# Patient Record
Sex: Female | Born: 1970 | Race: Black or African American | Hispanic: No | Marital: Single | Smoking: Never smoker
Health system: Southern US, Community
[De-identification: ages and names within clinical notes are randomized; demographics above are authoritative.]

## PROBLEM LIST (undated history)

## (undated) HISTORY — PX: KNEE ARTHROSCOPY: SUR90

---

## 2003-07-29 ENCOUNTER — Other Ambulatory Visit: Admission: RE | Admit: 2003-07-29 | Discharge: 2003-07-29 | Payer: Self-pay | Admitting: Obstetrics and Gynecology

## 2003-10-25 ENCOUNTER — Ambulatory Visit (HOSPITAL_COMMUNITY): Admission: RE | Admit: 2003-10-25 | Discharge: 2003-10-25 | Payer: Self-pay | Admitting: Obstetrics and Gynecology

## 2004-01-09 ENCOUNTER — Emergency Department (HOSPITAL_COMMUNITY): Admission: EM | Admit: 2004-01-09 | Discharge: 2004-01-10 | Payer: Self-pay | Admitting: Emergency Medicine

## 2004-01-10 ENCOUNTER — Emergency Department (HOSPITAL_COMMUNITY): Admission: EM | Admit: 2004-01-10 | Discharge: 2004-01-10 | Payer: Self-pay | Admitting: Emergency Medicine

## 2004-01-13 ENCOUNTER — Emergency Department (HOSPITAL_COMMUNITY): Admission: EM | Admit: 2004-01-13 | Discharge: 2004-01-13 | Payer: Self-pay | Admitting: Emergency Medicine

## 2017-01-08 ENCOUNTER — Encounter: Payer: Self-pay | Admitting: *Deleted

## 2017-01-08 ENCOUNTER — Ambulatory Visit
Admission: EM | Admit: 2017-01-08 | Discharge: 2017-01-08 | Disposition: A | Discharge status: Home / Self Care | Payer: 59 | Attending: Family Medicine | Admitting: Family Medicine

## 2017-01-08 DIAGNOSIS — R6889 Other general symptoms and signs: Secondary | ICD-10-CM

## 2017-01-08 LAB — RAPID STREP SCREEN (MED CTR MEBANE ONLY): STREPTOCOCCUS, GROUP A SCREEN (DIRECT): NEGATIVE

## 2017-01-08 MED ORDER — OSELTAMIVIR PHOSPHATE 75 MG PO CAPS: 75.0000 mg | ORAL_CAPSULE | Freq: Two times a day (BID) | ORAL | 0 refills | Status: AC

## 2017-01-08 MED ORDER — BENZONATATE 100 MG PO CAPS: 100.0000 mg | ORAL_CAPSULE | Freq: Three times a day (TID) | ORAL | 0 refills | Status: AC | PRN

## 2017-01-08 MED ORDER — HYDROCOD POLST-CPM POLST ER 10-8 MG/5ML PO SUER: 5.0000 mL | Freq: Every evening | ORAL | 0 refills | Status: AC | PRN

## 2017-01-08 NOTE — ED Provider Notes (Signed)
[facility] ____________________________________________  Time seen: Approximately 1035 AM  I have reviewed the triage vital signs and the nursing notes.   HISTORY  Chief Complaint Cough; Sore Throat; Fever; Chills; and Generalized Body Aches   HPI Elizabeth Lutz is a 46 y.o. female presenting for the complaints of 2 days of runny nose, nasal congestion, sore throat, chills and body aches. Reports chills and body aches, but denies known fevers. Reports has been taken some over-the-counter Tylenol intermittently. Reports cough is a dry hacking cough intermittently wakes her up, and states that she did not sleep well last night because of the cough. Reports cough is occasionally productive of whitish to yellowish mucus. Patient reports she had one single episode this morning of coughing forcefully several times in a row and then produced some mucus that had a very small amount of blood tinge to it. Denies any other blood in sputum events. Denies any blood present with blowing nose. Denies any other abnormal bleeding or bruising. Reports has continued to eat and drink well. Reports multiple sick contacts at work. Denies home sick contacts.   Denies chest pain, shortness of breath, chest pain with deep breath, abdominal pain, dysuria, extremity pain, extremity swelling or rash. Denies hematochezia, melena or abnormal colored stools. Denies recent sickness. Denies recent antibiotic use.    Patient's last menstrual period was 12/11/2016 (exact date).: Denies pregnancy.  History reviewed. No pertinent past medical history.  There are no active problems to display for this patient.   Past Surgical History:  Procedure Laterality Date  . KNEE ARTHROSCOPY Right      No current facility-administered medications for this encounter.   Current Outpatient Prescriptions:  .  benzonatate (TESSALON PERLES) 100 MG capsule, Take 1 capsule (100 mg total) by mouth 3 (three) times daily as  needed for cough., Disp: 15 capsule, Rfl: 0 .  chlorpheniramine-HYDROcodone (TUSSIONEX PENNKINETIC ER) 10-8 MG/5ML SUER, Take 5 mLs by mouth at bedtime as needed for cough. do not drive or operate machinery while taking as can cause drowsiness., Disp: 75 mL, Rfl: 0 .  oseltamivir (TAMIFLU) 75 MG capsule, Take 1 capsule (75 mg total) by mouth every 12 (twelve) hours., Disp: 10 capsule, Rfl: 0  Allergies Patient has no known allergies.  History reviewed. No pertinent family history.  Social History Social History  Substance Use Topics  . Smoking status: Never Smoker  . Smokeless tobacco: Never Used  . Alcohol use Yes    Review of Systems Constitutional: As above Eyes: No visual changes. ENT: Positive sore throat. Cardiovascular: Denies chest pain. Respiratory: Denies shortness of breath. Gastrointestinal: No abdominal pain.  No nausea, no vomiting.  No diarrhea.  No constipation. Genitourinary: Negative for dysuria. Musculoskeletal: Negative for back pain. Skin: Negative for rash. Neurological: Negative for headaches, focal weakness or numbness.  10-point ROS otherwise negative.  ____________________________________________   PHYSICAL EXAM:  VITAL SIGNS: ED Triage Vitals  Enc Vitals Group     BP 01/08/17 1025 (!) 141/90     Pulse Rate 01/08/17 1025 78     Resp 01/08/17 1025 16     Temp 01/08/17 1025 99.1 F (37.3 C)     Temp Source 01/08/17 1025 Oral     SpO2 01/08/17 1025 99 %     Weight 01/08/17 1028 145 lb (65.8 kg)     Height 01/08/17 1028 5\' 5"  (1.651 m)     Head Circumference --      Peak Flow --  Pain Score 01/08/17 1057 2     Pain Loc --      Pain Edu? --      Excl. in GC? --     Constitutional: Alert and oriented. Well appearing and in no acute distress. Eyes: Conjunctivae are normal. PERRL. EOMI. Head: Atraumatic. No sinus tenderness to palpation. No swelling. No erythema.  Ears: no erythema, normal TMs bilaterally.   Nose:Nasal congestion with  clear rhinorrhea  Mouth/Throat: Mucous membranes are moist. Mild pharyngeal erythema. No tonsillar swelling or exudate.  Neck: No stridor.  No cervical spine tenderness to palpation. Hematological/Lymphatic/Immunilogical: No cervical lymphadenopathy. Cardiovascular: Normal rate, regular rhythm. Grossly normal heart sounds.  Good peripheral circulation. Respiratory: Normal respiratory effort.  No retractions. No wheezes, rales or rhonchi. Good air movement. Dry intermittent cough noted in room. Gastrointestinal: Soft and nontender. No CVA tenderness. Musculoskeletal: Ambulatory with steady gait. No cervical, thoracic or lumbar tenderness to palpation. Neurologic:  Normal speech and language. No gait instability. Skin:  Skin appears warm, dry and intact. No rash noted. Psychiatric: Mood and affect are normal. Speech and behavior are normal. ___________________________________________   LABS (all labs ordered are listed, but only abnormal results are displayed)  Labs Reviewed  RAPID STREP SCREEN (NOT AT [facility])  CULTURE, GROUP A STREP [facility])     PROCEDURES Procedures     INITIAL IMPRESSION / ASSESSMENT AND PLAN / ED COURSE  Pertinent labs & imaging results that were available during my care of the patient were reviewed by me and considered in my medical decision making (see chart for details).  Well-appearing patient. No acute distress. Quick strep negative, will culture. Suspect influenza-like illness. Discussed with patient evaluation and treatment. Patient declined flu swab, and will treat with oral Tamiflu. When necessary Tessalon Perles and Tussionex at night. Encouraged rest, fluids, over-the-counter Tylenol or ibuprofen as needed. Work note given for today and tomorrow. Discussed strict follow-up and return parameters with patient including hemoptysis, abnormal bleeding, chest pain, shortness breath, worsening concerns.Discussed indication, risks and benefits of medications with  patient.  Discussed follow up with Primary care physician this week. Discussed follow up and return parameters including no resolution or any worsening concerns. Patient verbalized understanding and agreed to plan.   ____________________________________________   FINAL CLINICAL IMPRESSION(S) / ED DIAGNOSES  Final diagnoses:  Flu-like symptoms     Discharge Medication List as of 01/08/2017 10:55 AM    START taking these medications   Details  benzonatate (TESSALON PERLES) 100 MG capsule Take 1 capsule (100 mg total) by mouth 3 (three) times daily as needed for cough., Starting Wed 01/08/2017, Normal    chlorpheniramine-HYDROcodone (TUSSIONEX PENNKINETIC ER) 10-8 MG/5ML SUER Take 5 mLs by mouth at bedtime as needed for cough. do not drive or operate machinery while taking as can cause drowsiness., Starting Wed 01/08/2017, Print    oseltamivir (TAMIFLU) 75 MG capsule Take 1 capsule (75 mg total) by mouth every 12 (twelve) hours., Starting Wed 01/08/2017, Normal        Note: This dictation was prepared with Dragon dictation along with smaller phrase technology. Any transcriptional errors that result from this process are unintentional.         Renford Dills, NP 01/08/17 1236

## 2017-01-08 NOTE — ED Triage Notes (Signed)
Sore throat, fever, productive cough- yellow/bloody, chills, body aches, x2-3 days. OTC meds not working.

## 2017-01-08 NOTE — Discharge Instructions (Signed)
Take medication as prescribed. Rest. Drink plenty of fluids.  ° °Follow up with your primary care physician this week as needed. Return to Urgent care for new or worsening concerns.  ° °

## 2017-01-10 ENCOUNTER — Telehealth: Payer: Self-pay | Admitting: Emergency Medicine

## 2017-01-10 LAB — CULTURE, GROUP A STREP (THRC)

## 2017-01-10 MED ORDER — AMOXICILLIN 875 MG PO TABS: 875.0000 mg | ORAL_TABLET | Freq: Two times a day (BID) | ORAL | 0 refills | Status: AC

## 2017-01-10 NOTE — Telephone Encounter (Signed)
Patient notified that her throat culture came back positive for strep.  Renford DillsLindsey Miller, NP reviewed patient's result and would like Amoxicillin 875mg  1 tablet twice a day for 10 days be sent to patient's pharmacy.  Patient instructed to take medication as directed and to continue with her Tamiflu.  [facility] [place]  Patient verbalized understanding.

## 2017-02-25 ENCOUNTER — Encounter: Payer: Self-pay | Admitting: *Deleted

## 2017-02-25 ENCOUNTER — Ambulatory Visit
Admission: EM | Admit: 2017-02-25 | Discharge: 2017-02-25 | Disposition: A | Discharge status: Home / Self Care | Payer: Worker's Compensation | Attending: Family Medicine | Admitting: Family Medicine

## 2017-02-25 ENCOUNTER — Ambulatory Visit (INDEPENDENT_AMBULATORY_CARE_PROVIDER_SITE_OTHER): Payer: Worker's Compensation

## 2017-02-25 DIAGNOSIS — M25561 Pain in right knee: Secondary | ICD-10-CM

## 2017-02-25 LAB — PREGNANCY, URINE: PREG TEST UR: NEGATIVE

## 2017-02-25 MED ORDER — MELOXICAM 15 MG PO TABS: 15.0000 mg | ORAL_TABLET | Freq: Every day | ORAL | 0 refills | Status: AC | PRN

## 2017-02-25 NOTE — Discharge Instructions (Signed)
Take medication as prescribed. Rest. Drink plenty of fluids. Use knee immobilizer and crutches as discussed.  Follow-up with Tommie Maximiano CossAnne Moore NP this week as discussed. See above to call today to schedule.  Follow up with your primary care physician this week as needed. Return to Urgent care for new or worsening concerns.

## 2017-02-25 NOTE — ED Triage Notes (Signed)
Patient slipped at work and heard her right knee pop. Symptom of right knee pain occurred immediately afterwards. Patient has had right knee surgery  3 years ago.

## 2017-02-25 NOTE — ED Provider Notes (Signed)
[facility] ____________________________________________  Time seen: Approximately 2:04 PM  I have reviewed the triage vital signs and the nursing notes.   HISTORY  Chief Complaint Knee Injury and Work Related Injury   HPI [place] Luberta RobertsonCrowell is a 46 y.o. female presents for complaints of right knee pain since yesterday. Reports yesterday while at work, she was walking on a mat, and the mat moved and slid, causing her to twist her knee. States during twisting action she felt a pop and had onset of pain. Reports did not fall to the ground, but was able to catch herself. 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   Reports continued right knee pain, since injury. Denies other injury. States mild pain at rest, moderate pain with walking. Denies giving way sensation. Reports history of right knee surgery a few years ago, "removing fluid". Denies chronic knee pain. Denies head injury or loss of consciousness.   Denies chest pain, shortness of breath, abdominal pain, dysuria, or rash. Denies recent sickness. Denies recent antibiotic use.   Patient's last menstrual period was 01/11/2017.Reports tubal ligation and ablation, and reports partner with vasectomy. Denies pregnancy.    History reviewed. No pertinent past medical history.  There are no active problems to display for this patient.   Past Surgical History:  Procedure Laterality Date  . KNEE ARTHROSCOPY Right      No current facility-administered medications for this encounter.   Current Outpatient Prescriptions:  .  benzonatate (TESSALON PERLES) 100 MG capsule, Take 1 capsule (100 mg total) by mouth 3 (three) times daily as needed for cough., Disp: 15 capsule, Rfl: 0 .  chlorpheniramine-HYDROcodone (TUSSIONEX PENNKINETIC ER) 10-8 MG/5ML SUER, Take 5 mLs by mouth at bedtime as needed for cough. do not drive or operate machinery while taking as can cause drowsiness., Disp: 75 mL, Rfl: 0 .  meloxicam (MOBIC) 15 MG  tablet, Take 1 tablet (15 mg total) by mouth daily as needed for pain., Disp: 10 tablet, Rfl: 0 .  oseltamivir (TAMIFLU) 75 MG capsule, Take 1 capsule (75 mg total) by mouth every 12 (twelve) hours., Disp: 10 capsule, Rfl: 0  Allergies Patient has no known allergies.  History reviewed. No pertinent family history.  Social History Social History  Substance Use Topics  . Smoking status: Never Smoker  . Smokeless tobacco: Never Used  . Alcohol use Yes    Review of Systems Constitutional: No fever/chills Cardiovascular: Denies chest pain. Respiratory: Denies shortness of breath. Gastrointestinal: No abdominal pain.   Genitourinary: Negative for dysuria. Musculoskeletal: Negative for back pain.As above.  Skin: Negative for rash. Neurological: Negative for focal weakness or numbness.  10-point ROS otherwise negative.  ____________________________________________   PHYSICAL EXAM:  VITAL SIGNS: ED Triage Vitals  Enc Vitals Group     BP 02/25/17 1331 (!) 127/92     Pulse Rate 02/25/17 1331 72     Resp 02/25/17 1331 16     Temp 02/25/17 1331 98 F (36.7 C)     Temp Source 02/25/17 1331 Oral     SpO2 02/25/17 1331 97 %     Weight --      Height --      Head Circumference --      Peak Flow --      Pain Score 02/25/17 1333 9     Pain Loc --      Pain Edu? --      Excl. in GC? --     Constitutional: Alert and oriented. Well appearing  and in no acute distress. Eyes: Conjunctivae are normal. PERRL. EOMI. ENT      Head: Normocephalic and atraumatic. Hematological/Lymphatic/Immunilogical: No cervical lymphadenopathy. Cardiovascular: Normal rate, regular rhythm. Grossly normal heart sounds.  Good peripheral circulation. Respiratory: Normal respiratory effort without tachypnea nor retractions. Breath sounds are clear and equal bilaterally. No wheezes, rales, rhonchi. Musculoskeletal:  No midline cervical, thoracic or lumbar tenderness to palpation.  Except: right knee with  diffuse mild to moderate tenderness, increased tenderness focused anterior superior knee and at patella, no swelling, no ecchymosis, pain with resisted flexion and extension but full range of motion present, pain with anterior drawer test, and medial and lateral stress, right lower extremity otherwise nontender.  Neurologic:  Normal speech and language. Speech is normal.  Skin:  Skin is warm, dry and intact. No rash noted. Psychiatric: Mood and affect are normal. Speech and behavior are normal. Patient exhibits appropriate insight and judgment   ___________________________________________   LABS (all labs ordered are listed, but only abnormal results are displayed)  Labs Reviewed  PREGNANCY, URINE    RADIOLOGY  Dg Knee Complete 4 Views Right  Result Date: 02/25/2017 CLINICAL DATA:  Slipped at work, pain to the right knee EXAM: RIGHT KNEE - COMPLETE 4+ VIEW COMPARISON:  None. FINDINGS: No fracture or malalignment. Mild patellofemoral degenerative changes with bony spurring. Trace suprapatellar effusion. Soft tissue thickening of the infrapatellar soft tissues. There is minimal joint space narrowing medially. There is no fracture. IMPRESSION: No acute osseous abnormality. Soft tissue thickening of the infrapatellar soft tissues. Trace suprapatellar effusion. Electronically Signed   By: Jasmine Pang M.D.   On: 02/25/2017 15:16   ____________________________________________   PROCEDURES Procedures   INITIAL IMPRESSION / ASSESSMENT AND PLAN / ED COURSE  Pertinent labs & imaging results that were available during my care of the patient were reviewed by me and considered in my medical decision making (see chart for details).  Well-appearing patient. No acute distress. Presenting for right knee pain post mechanical injury at work yesterday. Worker's compensation injury. Right knee x-ray no acute osseous abnormality, trace suprapatellar effusion, soft tissue thickening of the infrapatellar  patellar soft tissues per radiologist. Discussed in detail with patient's concern for ligamentous or meniscal injury. Recommended for patient to follow-up with time he and more and then orthopedic. Information given for Tommie Maximiano Coss NP to call and follow-up for this week. Will start patient on daily Mobic. Encourage rest ice and supportive care.Discussed indication, risks and benefits of medications with patient.  Discussed follow up with Primary care physician this week. Discussed follow up and return parameters including no resolution or any worsening concerns. Patient verbalized understanding and agreed to plan.   ____________________________________________   FINAL CLINICAL IMPRESSION(S) / ED DIAGNOSES  Final diagnoses:  Acute pain of right knee     New Prescriptions   MELOXICAM (MOBIC) 15 MG TABLET    Take 1 tablet (15 mg total) by mouth daily as needed for pain.    Note: This dictation was prepared with Dragon dictation along with smaller phrase technology. Any transcriptional errors that result from this process are unintentional.         Renford Dills, NP 02/25/17 1539

## 2018-08-15 IMAGING — CR DG KNEE COMPLETE 4+V*R*
4 series · 4 of 4 positions shown · non-contrast
Comparison: None.

CLINICAL DATA: Slipped at work, pain to the right knee

EXAM:
RIGHT KNEE - COMPLETE 4+ VIEW

[knee ap]
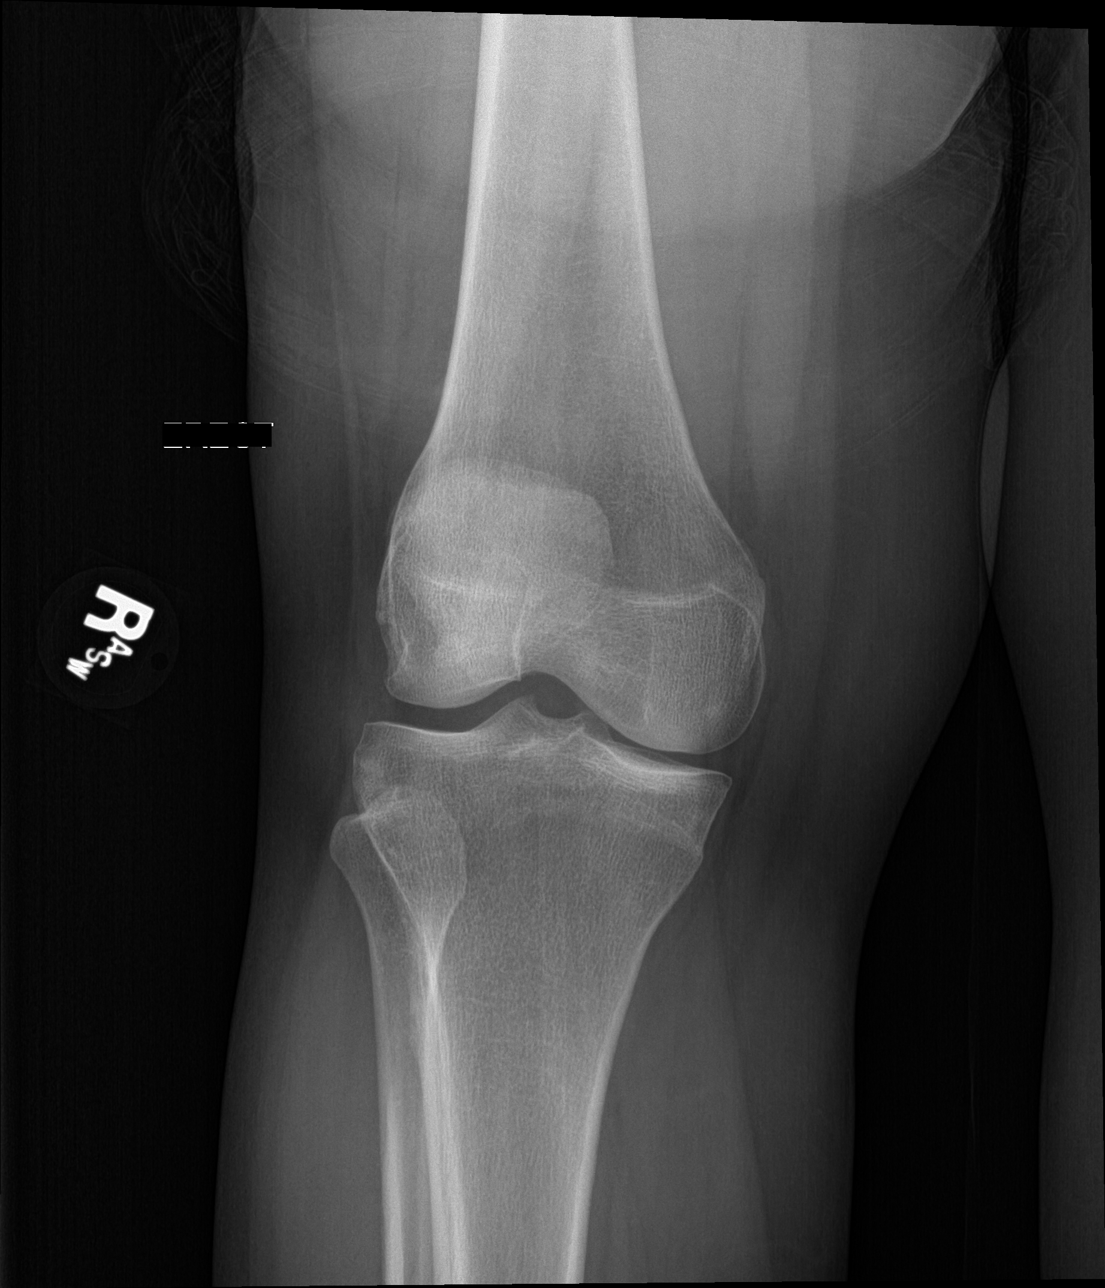

[knee lat]
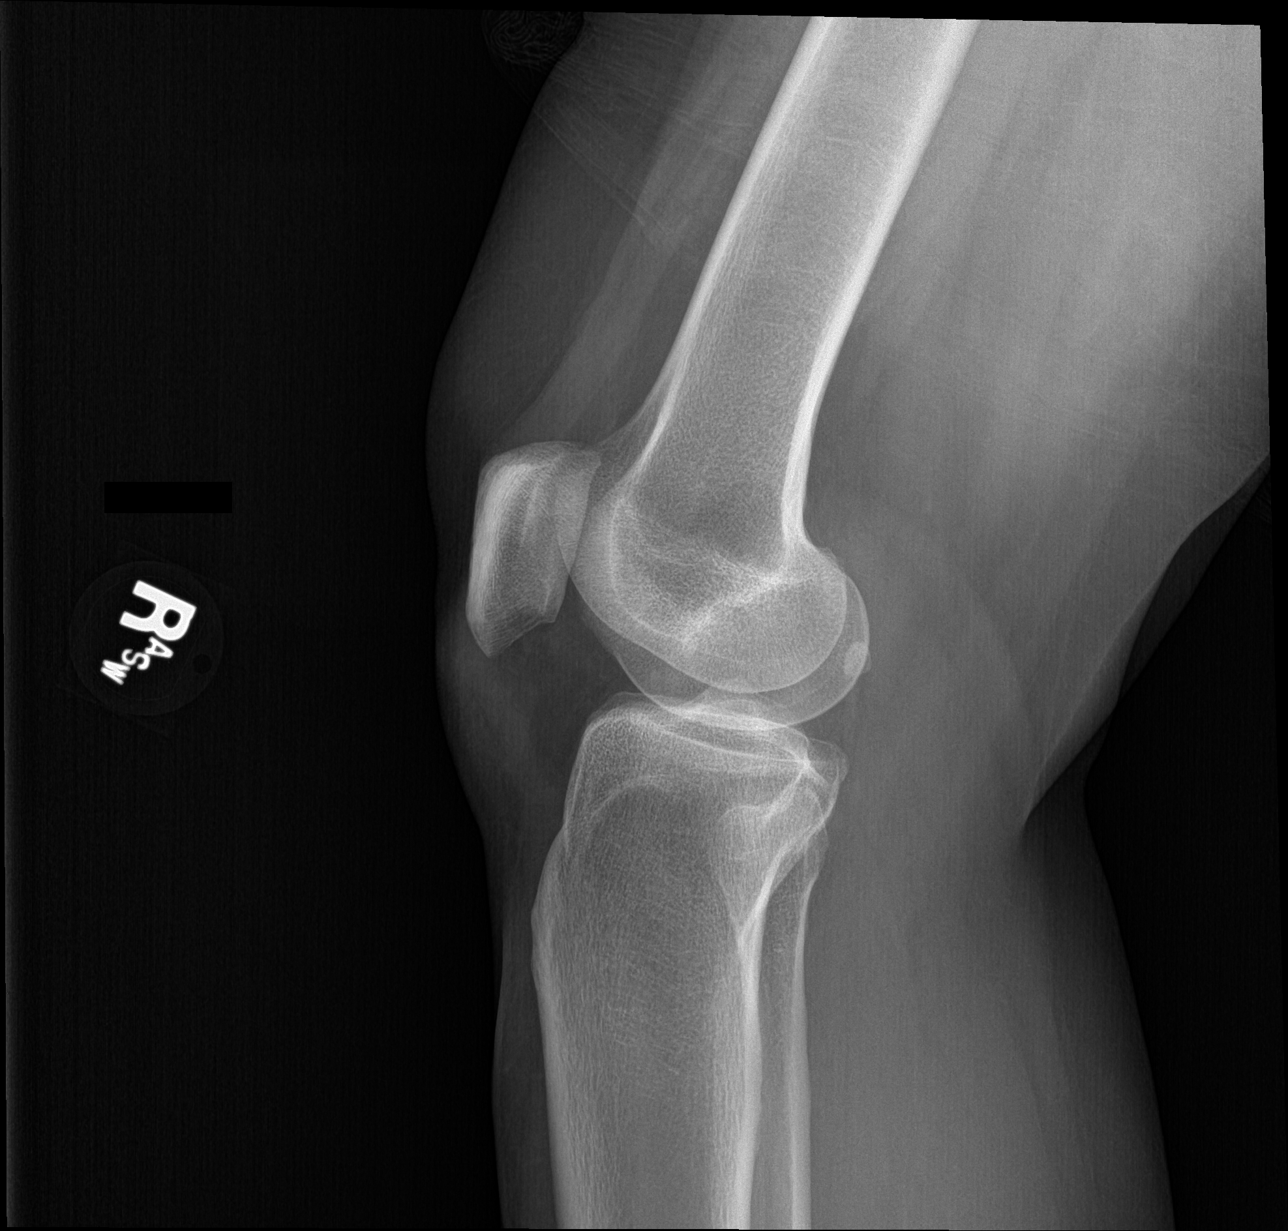

[patella skyline]
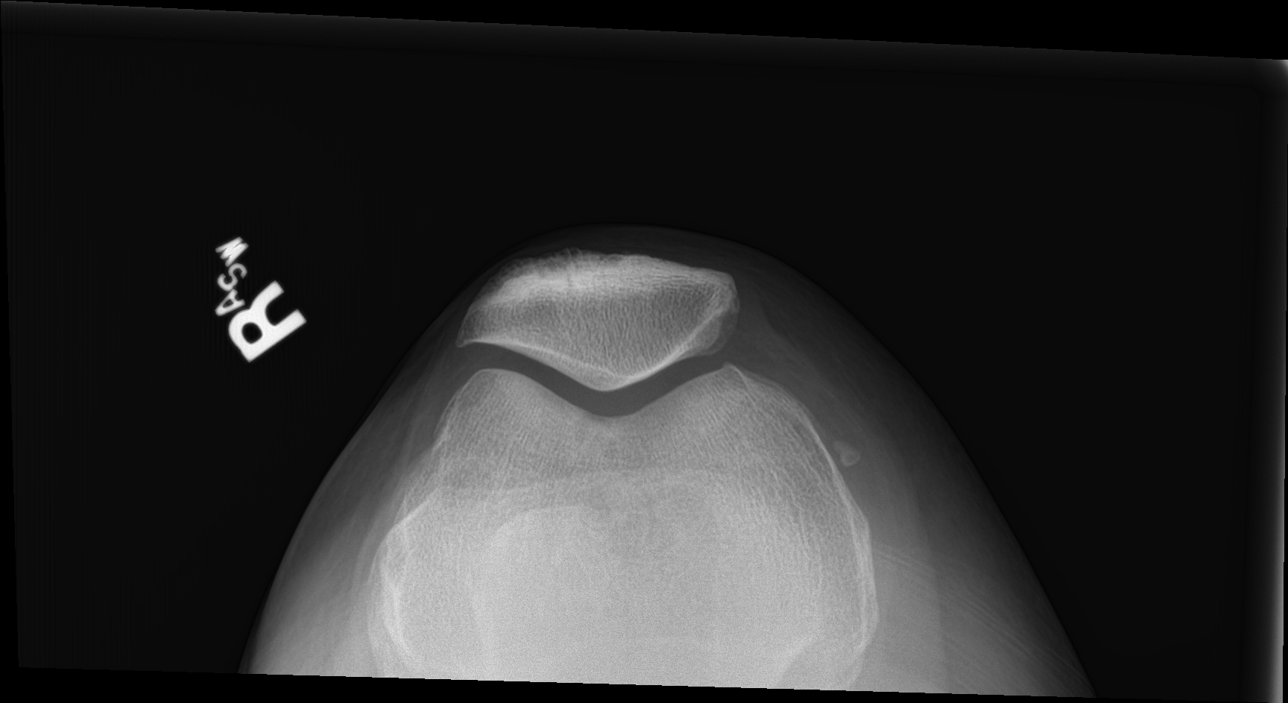

[tunnel]
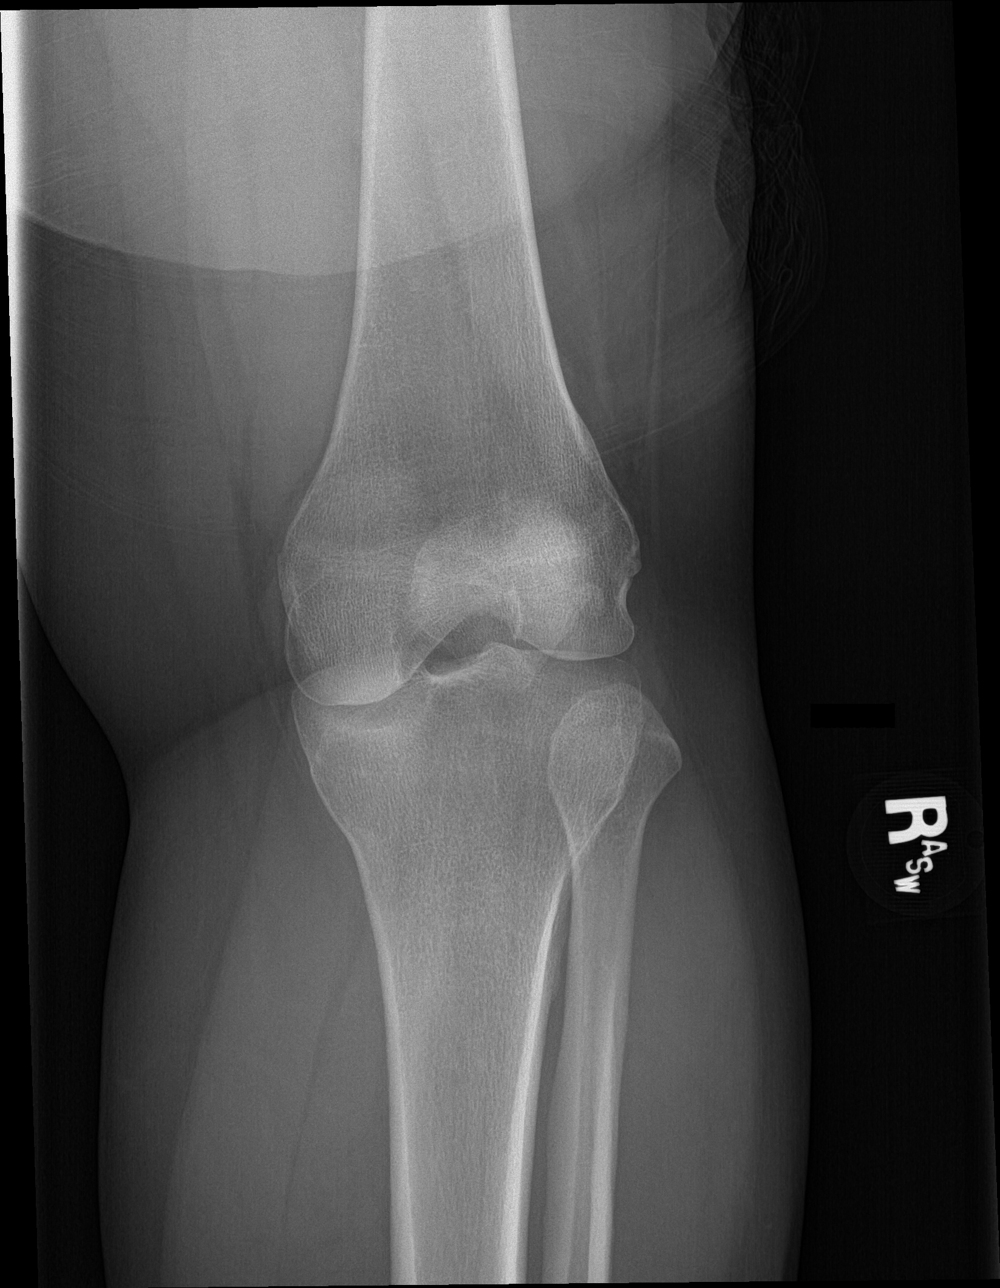

[4 of 4 positions shown; findings below may reference images not displayed]

FINDINGS: No fracture or malalignment. Mild patellofemoral degenerative
changes with bony spurring. Trace suprapatellar effusion. Soft
tissue thickening of the infrapatellar soft tissues.

There is minimal joint space narrowing medially. There is no
fracture.
IMPRESSION: No acute osseous abnormality. Soft tissue thickening of the
infrapatellar soft tissues. Trace suprapatellar effusion.
